# Patient Record
Sex: Female | Born: 1980 | Hispanic: No | Marital: Married | Smoking: Former smoker
Health system: Southern US, Community
[De-identification: ages and names within clinical notes are randomized; demographics above are authoritative.]

## PROBLEM LIST (undated history)

## (undated) ENCOUNTER — Inpatient Hospital Stay (HOSPITAL_COMMUNITY): Payer: Self-pay

## (undated) DIAGNOSIS — E669 Obesity, unspecified: Secondary | ICD-10-CM

## (undated) DIAGNOSIS — K625 Hemorrhage of anus and rectum: Secondary | ICD-10-CM

## (undated) HISTORY — DX: Obesity, unspecified: E66.9

## (undated) HISTORY — PX: NO PAST SURGERIES: SHX2092

---

## 2010-12-11 ENCOUNTER — Ambulatory Visit (INDEPENDENT_AMBULATORY_CARE_PROVIDER_SITE_OTHER): Payer: BC Managed Care – PPO | Admitting: Internal Medicine

## 2010-12-11 ENCOUNTER — Other Ambulatory Visit: Payer: Self-pay | Admitting: Internal Medicine

## 2010-12-11 ENCOUNTER — Ambulatory Visit (HOSPITAL_BASED_OUTPATIENT_CLINIC_OR_DEPARTMENT_OTHER)
Admission: RE | Admit: 2010-12-11 | Discharge: 2010-12-11 | Disposition: A | Discharge status: Home / Self Care | Payer: BC Managed Care – PPO | Source: Ambulatory Visit | Attending: Internal Medicine | Admitting: Internal Medicine

## 2010-12-11 DIAGNOSIS — R109 Unspecified abdominal pain: Secondary | ICD-10-CM | POA: Insufficient documentation

## 2010-12-11 DIAGNOSIS — K922 Gastrointestinal hemorrhage, unspecified: Secondary | ICD-10-CM

## 2010-12-11 DIAGNOSIS — E669 Obesity, unspecified: Secondary | ICD-10-CM

## 2010-12-11 DIAGNOSIS — Z975 Presence of (intrauterine) contraceptive device: Secondary | ICD-10-CM | POA: Insufficient documentation

## 2010-12-11 DIAGNOSIS — R918 Other nonspecific abnormal finding of lung field: Secondary | ICD-10-CM | POA: Insufficient documentation

## 2010-12-11 DIAGNOSIS — D279 Benign neoplasm of unspecified ovary: Secondary | ICD-10-CM | POA: Insufficient documentation

## 2010-12-11 MED ORDER — IOHEXOL 300 MG/ML  SOLN
100.0000 mL | Freq: Once | INTRAMUSCULAR | Status: AC | PRN
  Administered 2010-12-11: 100 mL via INTRAVENOUS

## 2010-12-13 ENCOUNTER — Ambulatory Visit: Payer: BC Managed Care – PPO | Admitting: Internal Medicine

## 2010-12-19 ENCOUNTER — Encounter: Payer: Self-pay | Admitting: Gastroenterology

## 2010-12-19 ENCOUNTER — Ambulatory Visit (INDEPENDENT_AMBULATORY_CARE_PROVIDER_SITE_OTHER): Payer: BC Managed Care – PPO | Admitting: Gastroenterology

## 2010-12-19 VITALS — BP 110/60 | HR 60 | Ht 65.5 in | Wt 232.8 lb

## 2010-12-19 DIAGNOSIS — K921 Melena: Secondary | ICD-10-CM

## 2010-12-19 DIAGNOSIS — R109 Unspecified abdominal pain: Secondary | ICD-10-CM

## 2010-12-19 DIAGNOSIS — K5900 Constipation, unspecified: Secondary | ICD-10-CM

## 2010-12-19 MED ORDER — PEG-KCL-NACL-NASULF-NA ASC-C 100 G PO SOLR: 1.0000 | Freq: Once | ORAL | Status: AC

## 2010-12-19 NOTE — Patient Instructions (Addendum)
You have been scheduled for a Colonoscopy. High fiber diet information given to patient. Start Miralax 17 gr in 8oz of water once or twice daily as needed. ZO:XWRUEA Schoenhoff, MD

## 2010-12-19 NOTE — Progress Notes (Signed)
History of Present Illness: This is a 30 year old female presenting today for evaluation of new-onset constipation, lower abdominal pain and rectal bleeding. She relates a history of bright red blood per rectum on the tissue paper, stool and in the commode that occurred approximately one year ago. Her symptoms recurred about 2 weeks ago. She describes blood on the tissue paper, on the stool and in the commode associated with a bowel movement. She has had constipation with straining for the past 2 weeks. This has also been associated with lower abdominal pain that is relieved by a bowel movement. She underwent a CT scan of the abdomen and pelvis on May 1 which was unremarkable. CBC, metabolic panel and TSH on May 1 was all normal. She denies weight loss, change in stool caliber, nausea, vomiting, fevers, chills.  History reviewed. No pertinent past medical history. History reviewed. No pertinent past surgical history.  reports that she quit smoking about 2 years ago. She has never used smokeless tobacco. She reports that she drinks alcohol. She reports that she does not use illicit drugs. family history includes Diabetes in her maternal aunt and maternal grandmother and Pancreatic cancer in her maternal grandmother.  There is no history of Colon cancer. No Known Allergies  Outpatient Encounter Prescriptions as of 12/19/2010  Medication Sig Dispense Refill  . peg 3350 powder (MOVIPREP) 100 G SOLR Take 1 kit (100 g total) by mouth once.  1 kit  0   Review of Systems: Pertinent positive and negative review of systems were noted in the above HPI section. All other review of systems were otherwise negative.  Physical Exam: General: Well developed , well nourished, no acute distress Head: Normocephalic and atraumatic Eyes:  sclerae anicteric, EOMI Ears: Normal auditory acuity Mouth: No deformity or lesions Neck: Supple, no masses or thyromegaly Lungs: Clear throughout to auscultation Heart: Regular rate  and rhythm; no murmurs, rubs or bruits Abdomen: Soft, non tender and non distended. No masses, hepatosplenomegaly or hernias noted. Normal Bowel sounds Rectal: Deferred to colonoscopy, recent exam by her primary care physician showed no lesions and Hemoccult-positive stool Musculoskeletal: Symmetrical with no gross deformities  Skin: No lesions on visible extremities Pulses:  Normal pulses noted Extremities: No clubbing, cyanosis, edema or deformities noted Neurological: Alert oriented x 4, grossly nonfocal Cervical Nodes:  No significant cervical adenopathy Inguinal Nodes: No significant inguinal adenopathy Psychological:  Alert and cooperative. Normal mood and affect  Assessment and Recommendations:  1. Hematochezia and Hemoccult-positive stool. I suspect she has a benign anorectal source such as hemorrhoids however proctitis, inflammatory bowel disease and colorectal neoplasms need to be definitively excluded. Schedule colonoscopy. The risks, benefits, and alternatives to colonoscopy with possible biopsy and possible polypectomy were discussed with the patient and they consent to proceed.    2. Recent onset constipation associated with mild lower abdominal pain. The abdominal pain is promptly relieved with bowel movements and her recent CT scan of the abdomen and pelvis was negative. I suspect the pain is simply related to constipation. Substantially increase her dietary fiber and water intake. She may use MiraLax once or twice daily as needed. Further evaluation of this sudden change in bowel habits with colonoscopy as above.

## 2010-12-24 ENCOUNTER — Encounter: Payer: Self-pay | Admitting: Gastroenterology

## 2010-12-26 ENCOUNTER — Ambulatory Visit: Payer: BC Managed Care – PPO | Admitting: Internal Medicine

## 2010-12-26 ENCOUNTER — Ambulatory Visit: Payer: BC Managed Care – PPO | Admitting: Obstetrics & Gynecology

## 2011-01-01 ENCOUNTER — Other Ambulatory Visit: Payer: BC Managed Care – PPO | Admitting: Gastroenterology

## 2011-01-02 ENCOUNTER — Encounter: Payer: Self-pay | Admitting: Internal Medicine

## 2011-01-30 ENCOUNTER — Encounter: Payer: Self-pay | Admitting: Internal Medicine

## 2011-05-31 ENCOUNTER — Emergency Department (HOSPITAL_BASED_OUTPATIENT_CLINIC_OR_DEPARTMENT_OTHER)
Admission: EM | Admit: 2011-05-31 | Discharge: 2011-05-31 | Disposition: A | Discharge status: Home / Self Care | Payer: Self-pay | Attending: Emergency Medicine | Admitting: Emergency Medicine

## 2011-05-31 ENCOUNTER — Encounter (HOSPITAL_BASED_OUTPATIENT_CLINIC_OR_DEPARTMENT_OTHER): Payer: Self-pay

## 2011-05-31 DIAGNOSIS — S335XXA Sprain of ligaments of lumbar spine, initial encounter: Secondary | ICD-10-CM | POA: Insufficient documentation

## 2011-05-31 DIAGNOSIS — X500XXA Overexertion from strenuous movement or load, initial encounter: Secondary | ICD-10-CM | POA: Insufficient documentation

## 2011-05-31 DIAGNOSIS — R51 Headache: Secondary | ICD-10-CM | POA: Insufficient documentation

## 2011-05-31 DIAGNOSIS — E669 Obesity, unspecified: Secondary | ICD-10-CM | POA: Insufficient documentation

## 2011-05-31 DIAGNOSIS — S39012A Strain of muscle, fascia and tendon of lower back, initial encounter: Secondary | ICD-10-CM

## 2011-05-31 DIAGNOSIS — R079 Chest pain, unspecified: Secondary | ICD-10-CM | POA: Insufficient documentation

## 2011-05-31 HISTORY — DX: Hemorrhage of anus and rectum: K62.5

## 2011-05-31 LAB — PREGNANCY, URINE: Preg Test, Ur: NEGATIVE

## 2011-05-31 LAB — URINALYSIS, ROUTINE W REFLEX MICROSCOPIC
Ketones, ur: NEGATIVE mg/dL
Leukocytes, UA: NEGATIVE
Nitrite: NEGATIVE
pH: 5.5 (ref 5.0–8.0)

## 2011-05-31 MED ORDER — IBUPROFEN 800 MG PO TABS
800.0000 mg | ORAL_TABLET | Freq: Once | ORAL | Status: AC
  Administered 2011-05-31: 800 mg via ORAL
  Filled 2011-05-31: qty 1

## 2011-05-31 MED ORDER — IBUPROFEN 800 MG PO TABS: 800.0000 mg | ORAL_TABLET | Freq: Three times a day (TID) | ORAL | Status: AC

## 2011-05-31 NOTE — ED Notes (Signed)
Lower back pain started this am-denies injury-nausea/dirrhea-denies urinary s/s

## 2011-05-31 NOTE — ED Provider Notes (Signed)
History     CSN: [id number] Arrival date & time: 05/31/2011  2:10 PM   First MD Initiated Contact with Patient 05/31/11 1428      Chief Complaint  Patient presents with  . Back Pain    (Consider location/radiation/quality/duration/timing/severity/associated sxs/prior treatment) HPI Comments: Patient complains of pain in her lower. This seemed to come on this morning. She also notes some headache, mild pain in the chest stinging feeling in her abdomen, and some numbness in her hands. There is some nausea and diarrhea. She feels hot. Her health is usually good except for obesity.  Patient is a 30 y.o. female presenting with back pain. The history is provided by the patient.  Back Pain  This is a new problem. The current episode started 12 to 24 hours ago. The problem occurs constantly. The problem has not changed since onset.The pain is associated with no known injury. The pain is present in the lumbar spine. The quality of the pain is described as aching. The pain does not radiate. The pain is at a severity of 8/10. The pain is moderate. The symptoms are aggravated by bending and twisting. Associated symptoms include chest pain, headaches and abdominal pain. Pertinent negatives include no dysuria. She has tried nothing for the symptoms. Risk factors include obesity and lack of exercise.    Past Medical History  Diagnosis Date  . Obesity   . Rectal bleeding     Past Surgical History  Procedure Date  . No past surgeries     Family History  Problem Relation Age of Onset  . Pancreatic cancer Maternal Grandmother   . Diabetes Maternal Grandmother   . Diabetes Maternal Aunt   . Colon cancer Neg Hx     History  Substance Use Topics  . Smoking status: Current Everyday Smoker    Last Attempt to Quit: 08/12/2008  . Smokeless tobacco: Never Used  . Alcohol Use: No     socially    OB History    Grav Para Term Preterm Abortions TAB SAB Ect Mult Living                  Review  of Systems  Constitutional: Negative.   Eyes: Negative.   Respiratory: Negative.   Cardiovascular: Positive for chest pain.  Gastrointestinal: Positive for nausea and abdominal pain.  Genitourinary: Negative for dysuria.  Musculoskeletal: Positive for back pain.  Neurological: Positive for headaches.  Psychiatric/Behavioral: Negative.     Allergies  Review of patient's allergies indicates no known allergies.  Home Medications   Current Outpatient Rx  Name Route Sig Dispense Refill  . IBUPROFEN 800 MG PO TABS Oral Take 1 tablet (800 mg total) by mouth 3 (three) times daily. 21 tablet 0  . ONE-DAILY MULTI VITAMINS PO TABS Oral Take 1 tablet by mouth daily.      . WEIGHT LOSS DAILY MULTI PO Oral Take 1 tablet by mouth. As directed       BP 130/84  Pulse 111  Temp(Src) 98.8 F (37.1 C) (Oral)  Resp 18  Ht 5\' 6"  (1.676 m)  Wt 240 lb (108.863 kg)  BMI 38.74 kg/m2  SpO2 98%  LMP 05/27/2011  Physical Exam  Constitutional: She is oriented to person, place, and time.       Patient is a morbidly obese woman in mild distress she localizes pain to the lumbar region.  HENT:  Head: Normocephalic and atraumatic.  Right Ear: External ear normal.  Left Ear: External ear normal.  Mouth/Throat: Oropharynx is clear and moist.  Eyes: Conjunctivae and EOM are normal. Pupils are equal, round, and reactive to light.  Neck: Normal range of motion. Neck supple. No thyromegaly present.  Cardiovascular: Normal rate, regular rhythm and normal heart sounds.   Pulmonary/Chest: Effort normal and breath sounds normal.  Abdominal: Soft. Bowel sounds are normal. She exhibits no distension. There is no tenderness.  Musculoskeletal:       She localizes pain to the lumbar region. There is no palpable deformity or point tenderness there.  Lymphadenopathy:    She has no cervical adenopathy.  Neurological: She is alert and oriented to person, place, and time.       There is no sensory or motor deficit.    Skin: Skin is warm and dry.  Psychiatric: She has a normal mood and affect. Her behavior is normal.    ED Course  Procedures (including critical care time)  3:09 PM Patient's physical exam was entirely benign. There was no indication for imaging. I prescribed her ibuprofen 800 mg 3 times a day. She should rest on a firm surface, and avoid heavy lifting.  Labs Reviewed  PREGNANCY, URINE  URINALYSIS, ROUTINE W REFLEX MICROSCOPIC   No results found.   1. Lumbar strain          Carleene Cooper III, MD 05/31/11 [phone]

## 2011-05-31 NOTE — ED Notes (Signed)
Dc instruction provided, meds reviewed, questions answered, gait steady and able to ambulate.

## 2011-11-02 IMAGING — CT CT ABD-PELV W/ CM
2 of 3 series · 16 of 46 positions shown, 18 images · IV contrast (APPLIED)
Comparison: None.

CLINICAL DATA: Lower abdominal pain, recurrent GI bleeding

CT ABDOMEN AND PELVIS WITH CONTRAST
TECHNIQUE: Multidetector CT imaging of the abdomen and pelvis was
performed following the standard protocol during bolus
administration of intravenous contrast.
Contrast: 100 ml Hmnipaque-KJJ

[Series 2: abd/pelvis 5.0 b31f · axial · 0.85mm/px · z∈[-265,+155]mm · 13 of 98 slices shown, 15 images]
[im 7/98  soft-tissue]
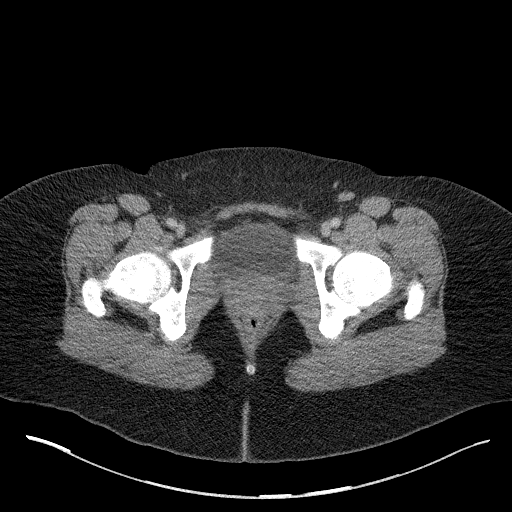
[im 7/98  bone]
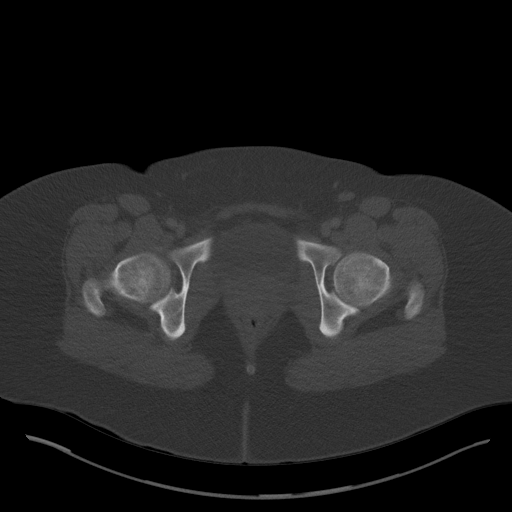
[im 13/98  soft-tissue]
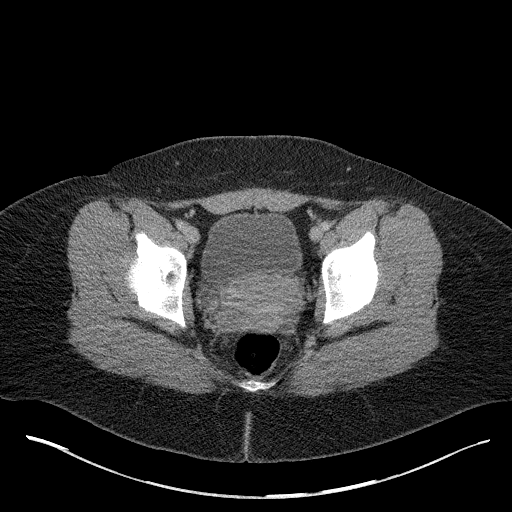
[im 19/98  soft-tissue]
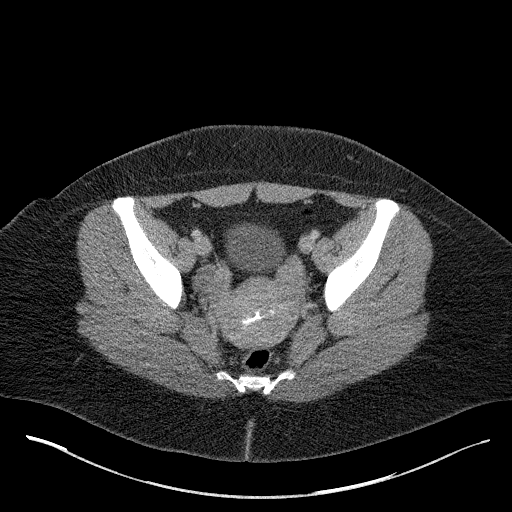
[im 29/98  soft-tissue]
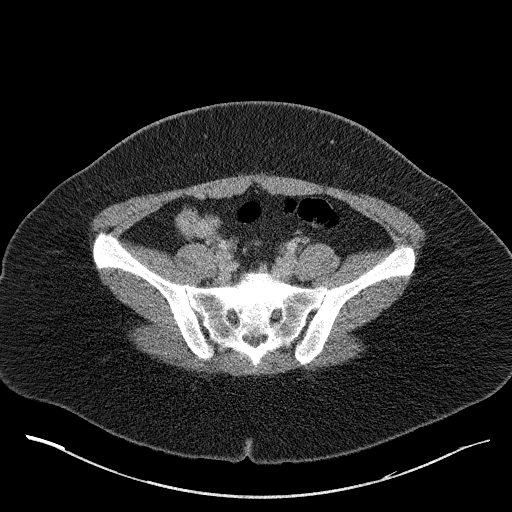
[im 35/98  soft-tissue]
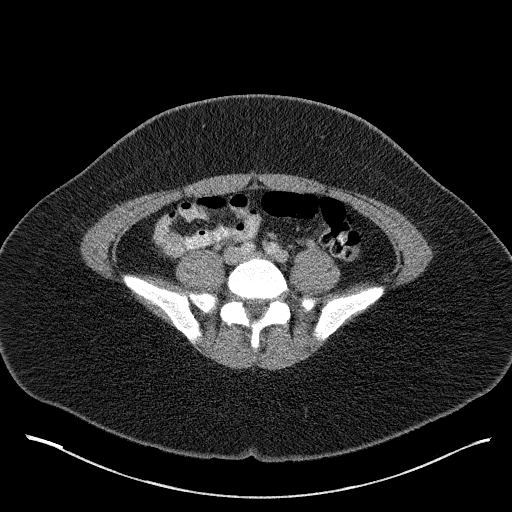
[im 41/98  soft-tissue]
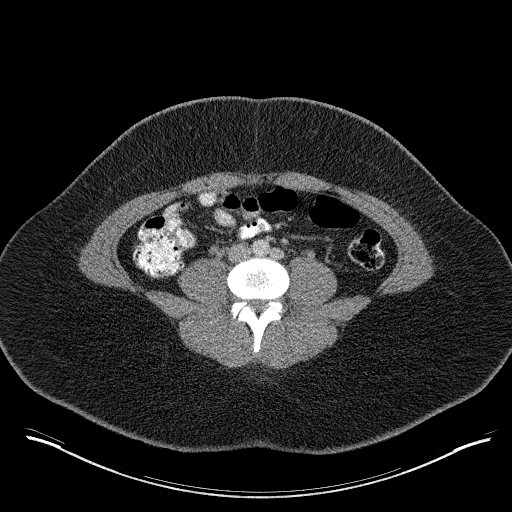
[im 51/98  soft-tissue]
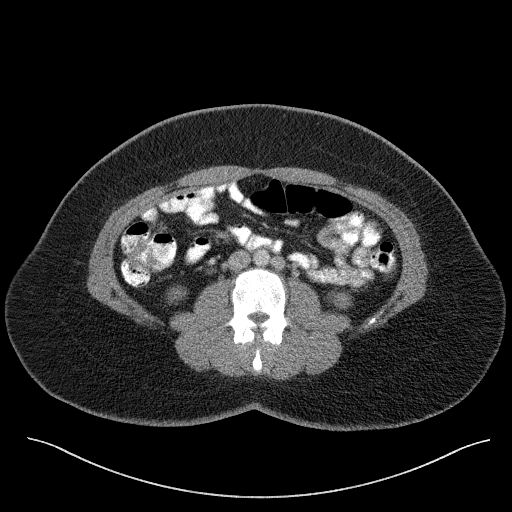
[im 57/98  soft-tissue]
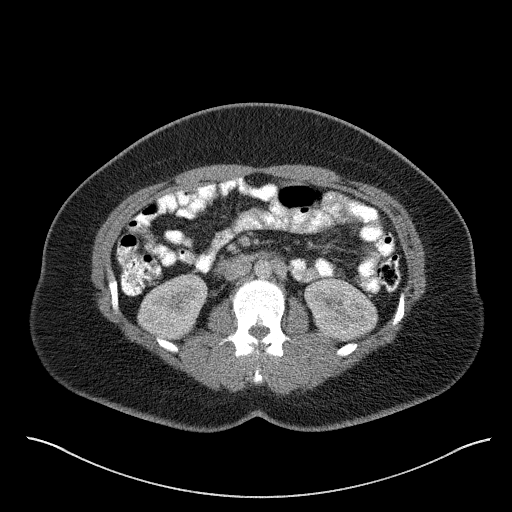
[im 63/98  soft-tissue]
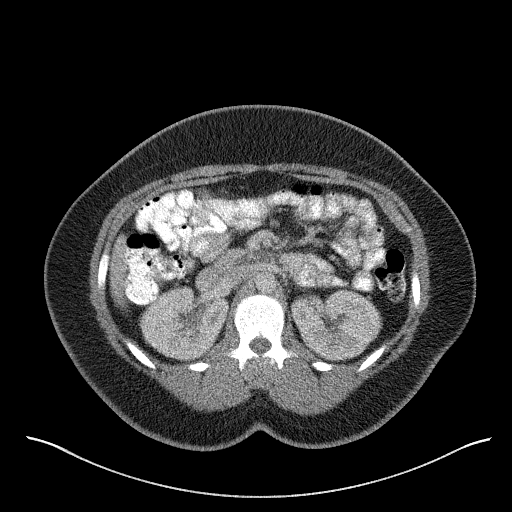
[im 63/98  bone]
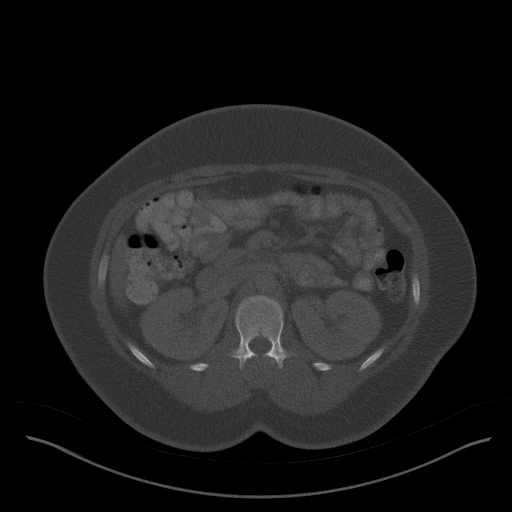
[im 69/98  soft-tissue]
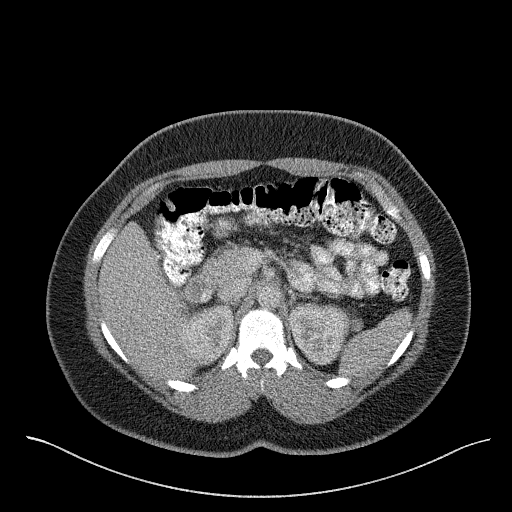
[im 79/98  soft-tissue]
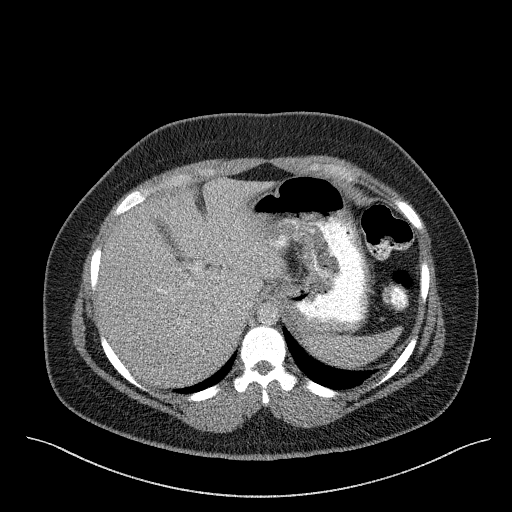
[im 85/98  soft-tissue]
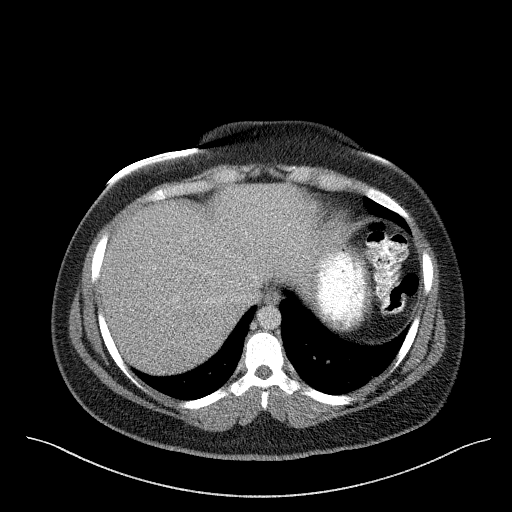
[im 91/98  soft-tissue]
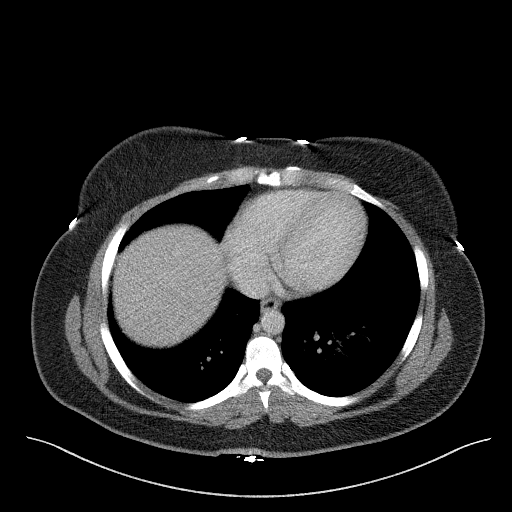

[Series 5: abd/pelvis 3.0 coronal · coronal · 0.79mm/px · 3 of 102 slices shown]
[im 34/102  soft-tissue]
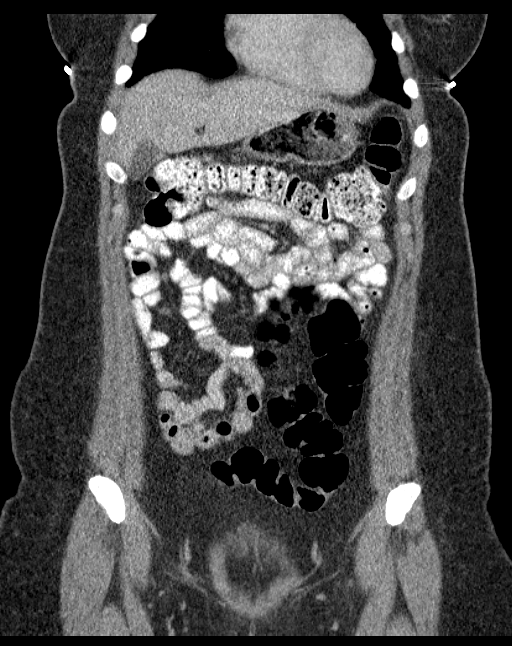
[im 45/102  soft-tissue]
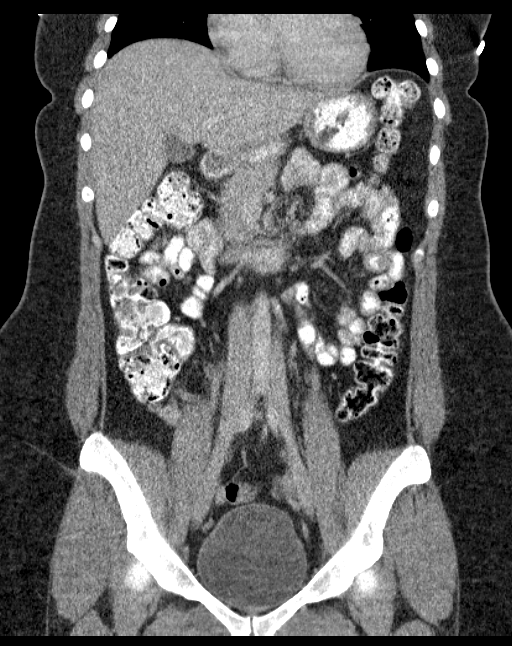
[im 57/102  soft-tissue]
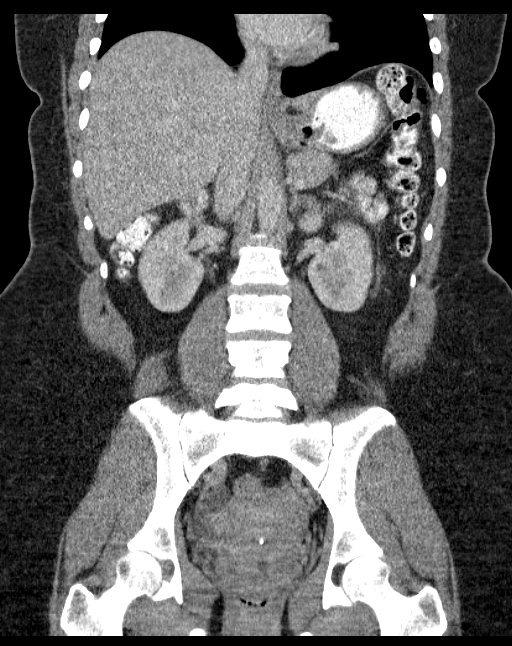

[16 of 46 positions shown; findings below may reference images not displayed]

FINDINGS: There is parenchymal opacity within the posterior
inferior left lower lobe suspicious for pneumonia.  Clinical
correlation is recommended.

The liver enhances with no focal abnormality and no ductal
dilatation is seen.  No calcified gallstones are noted.  The
pancreas is normal in size and the pancreatic duct is not dilated.
The adrenal glands and spleen are unremarkable.  The kidneys
enhance with no calculus or mass and no hydronephrosis is seen.
The abdominal aorta in caliber.

The appendix and terminal ileum are well seen and appear normal.
The uterus is normal in size.  An IUD is noted centrally.  The
urinary bladder is unremarkable.  There is a cystic structure in
the right adnexa consistent a right ovarian cyst of 31 x 23 mm.  No
abnormality of the colon is seen.  No bony abnormality is noted.
IMPRESSION: 1.  Parenchymal opacity in the posterior left lower lobe suspicious
for pneumonia.  Correlate clinically.
2.  Right ovarian cyst.  IUD within the mid uterus.
3.  The appendix and terminal ileum appear normal.

## 2013-05-15 ENCOUNTER — Emergency Department (INDEPENDENT_AMBULATORY_CARE_PROVIDER_SITE_OTHER)
Admission: EM | Admit: 2013-05-15 | Discharge: 2013-05-15 | Disposition: A | Discharge status: Home / Self Care | Payer: Self-pay | Source: Home / Self Care | Attending: Emergency Medicine | Admitting: Emergency Medicine

## 2013-05-15 ENCOUNTER — Other Ambulatory Visit (HOSPITAL_COMMUNITY)
Admission: RE | Admit: 2013-05-15 | Discharge: 2013-05-15 | Disposition: A | Discharge status: Home / Self Care | Payer: BC Managed Care – PPO | Source: Ambulatory Visit | Attending: Emergency Medicine | Admitting: Emergency Medicine

## 2013-05-15 ENCOUNTER — Encounter (HOSPITAL_COMMUNITY): Payer: Self-pay | Admitting: Emergency Medicine

## 2013-05-15 DIAGNOSIS — R110 Nausea: Secondary | ICD-10-CM

## 2013-05-15 DIAGNOSIS — N760 Acute vaginitis: Secondary | ICD-10-CM

## 2013-05-15 DIAGNOSIS — R102 Pelvic and perineal pain unspecified side: Secondary | ICD-10-CM

## 2013-05-15 DIAGNOSIS — R350 Frequency of micturition: Secondary | ICD-10-CM

## 2013-05-15 DIAGNOSIS — Z113 Encounter for screening for infections with a predominantly sexual mode of transmission: Secondary | ICD-10-CM | POA: Insufficient documentation

## 2013-05-15 DIAGNOSIS — R5383 Other fatigue: Secondary | ICD-10-CM

## 2013-05-15 DIAGNOSIS — M545 Low back pain, unspecified: Secondary | ICD-10-CM

## 2013-05-15 DIAGNOSIS — R5381 Other malaise: Secondary | ICD-10-CM

## 2013-05-15 DIAGNOSIS — N949 Unspecified condition associated with female genital organs and menstrual cycle: Secondary | ICD-10-CM

## 2013-05-15 LAB — POCT URINALYSIS DIP (DEVICE)
Ketones, ur: NEGATIVE mg/dL
Leukocytes, UA: NEGATIVE
Protein, ur: NEGATIVE mg/dL
Urobilinogen, UA: 0.2 mg/dL (ref 0.0–1.0)

## 2013-05-15 LAB — POCT I-STAT, CHEM 8
Calcium, Ion: 1.18 mmol/L (ref 1.12–1.23)
Chloride: 105 mEq/L (ref 96–112)
HCT: 43 % (ref 36.0–46.0)
TCO2: 23 mmol/L (ref 0–100)

## 2013-05-15 LAB — POCT PREGNANCY, URINE: Preg Test, Ur: NEGATIVE

## 2013-05-15 MED ORDER — CEFTRIAXONE SODIUM 250 MG IJ SOLR
INTRAMUSCULAR | Status: AC
  Filled 2013-05-15: qty 250

## 2013-05-15 MED ORDER — CEFTRIAXONE SODIUM 250 MG IJ SOLR
250.0000 mg | Freq: Once | INTRAMUSCULAR | Status: AC
  Administered 2013-05-15: 250 mg via INTRAMUSCULAR

## 2013-05-15 MED ORDER — MELOXICAM 15 MG PO TABS: 15.0000 mg | ORAL_TABLET | Freq: Every day | ORAL | Status: DC

## 2013-05-15 MED ORDER — METRONIDAZOLE 500 MG PO TABS: 500.0000 mg | ORAL_TABLET | Freq: Three times a day (TID) | ORAL | Status: DC

## 2013-05-15 MED ORDER — AZITHROMYCIN 250 MG PO TABS
ORAL_TABLET | ORAL | Status: AC
  Filled 2013-05-15: qty 4

## 2013-05-15 MED ORDER — AZITHROMYCIN 250 MG PO TABS
1000.0000 mg | ORAL_TABLET | Freq: Once | ORAL | Status: AC
  Administered 2013-05-15: 1000 mg via ORAL
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
## 2013-05-15 NOTE — ED Provider Notes (Signed)
Chief Complaint:   Chief Complaint  Patient presents with  . Fatigue    History of Present Illness:   Brianna Figueroa is a 32 year old female who presents with multiple complaints today all going on for about 2 weeks. She felt tired, sleepy, and somewhat stressed out. Patient states she has some blurring of her vision. She's been nauseated and had some left lower quadrant pain. She's had left lower back pain for about one to 2 years. She does frequent urination, excessive thirst, vaginal discharge and itching, nasal congestion, hair loss, and dizzy spells for the past week. The patient is sexually active with use of condoms. Last menstrual period was June 27. She took a home pregnancy test which was negative. She denies fever, chills, headache, sore throat, adenopathy, shortness of breath, chest pain, nausea, vomiting, diarrhea, dysuria, or extremity pain, paresthesias, weakness, or swelling. Her biggest concern is to find out if she is pregnant.  Review of Systems:  Other than noted above, the patient denies any of the following symptoms. Systemic:  No fever, chills, sweats, fatigue, myalgias, headache, or anorexia. Eye:  No redness, pain or drainage. ENT:  No earache, nasal congestion, rhinorrhea, sinus pressure, or sore throat. Lungs:  No cough, sputum production, wheezing, shortness of breath.  Cardiovascular:  No chest pain, palpitations, or syncope. GI:  No nausea, vomiting, abdominal pain or diarrhea. GU:  No dysuria, frequency, or hematuria. Skin:  No rash or pruritis.  PMFSH:  Past medical history, family history, social history, meds, and allergies were reviewed.    Physical Exam:   Vital signs:  BP 121/84  Pulse 76  Temp(Src) 98 F (36.7 C) (Oral)  Resp 18  SpO2 97%  LMP 02/02/2013 General:  Alert, in no distress. Eye:  PERRL, full EOMs.  Lids and conjunctivas were normal. ENT:  TMs and canals were normal, without erythema or inflammation.  Nasal mucosa was clear and  uncongested, without drainage.  Mucous membranes were moist.  Pharynx was clear, without exudate or drainage.  There were no oral ulcerations or lesions. Neck:  Supple, no adenopathy, tenderness or mass. Thyroid was normal. Lungs:  No respiratory distress.  Lungs were clear to auscultation, without wheezes, rales or rhonchi.  Breath sounds were clear and equal bilaterally. Heart:  Regular rhythm, without gallops, murmers or rubs. Abdomen:  Soft, flat, and non-tender to palpation.  No hepatosplenomagaly or mass. Pelvic: Normal external genitalia, vaginal and cervical mucosa were normal. There was no discharge or bleeding. She has no cervical motion pain. Uterus is mid position normal in size and shape. There is mild uterine tenderness and moderate left adnexal tenderness with no mass. No right adnexal tenderness or mass. DNA probes for gonorrhea, Chlamydia, Trichomonas, Gardnerella, Candida were obtained. Back: There is tenderness to palpation in the left paravertebral area. Her back has about a 50% of normal range of motion with pain. Straight leg raising is negative. Neurological exam: DTRs, muscle strength, and sensation in lower extremities. Extremities: No edema, pulses were full. Skin:  Clear, warm, and dry, without rash or lesions.  Labs:   Results for orders placed during the hospital encounter of 05/15/13  POCT URINALYSIS DIP (DEVICE)      Result Value Range   Glucose, UA NEGATIVE  NEGATIVE mg/dL   Bilirubin Urine NEGATIVE  NEGATIVE   Ketones, ur NEGATIVE  NEGATIVE mg/dL   Specific Gravity, Urine 1.015  1.005 - 1.030   Hgb urine dipstick NEGATIVE  NEGATIVE   pH 7.0  5.0 -  8.0   Protein, ur NEGATIVE  NEGATIVE mg/dL   Urobilinogen, UA 0.2  0.0 - 1.0 mg/dL   Nitrite NEGATIVE  NEGATIVE   Leukocytes, UA NEGATIVE  NEGATIVE  POCT PREGNANCY, URINE      Result Value Range   Preg Test, Ur NEGATIVE  NEGATIVE  POCT I-STAT, CHEM 8      Result Value Range   Sodium 141  135 - 145 mEq/L    Potassium 3.8  3.5 - 5.1 mEq/L   Chloride 105  96 - 112 mEq/L   BUN 12  6 - 23 mg/dL   Creatinine, Ser 1.61  0.50 - 1.10 mg/dL   Glucose, Bld 92  70 - 99 mg/dL   Calcium, Ion 0.96  0.45 - 1.23 mmol/L   TCO2 23  0 - 100 mmol/L   Hemoglobin 14.6  12.0 - 15.0 g/dL   HCT 40.9  81.1 - 91.4 %    Course in Urgent Care Center:   Given Rocephin 250 mg IM and azithromycin 1000 mg by mouth.  Assessment:  The primary encounter diagnosis was Fatigue. Diagnoses of Nausea, Pelvic pain, Low back pain, Vaginitis, and Frequent urination were also pertinent to this visit.  Cause of the fatigue remains unknown. Pelvic pain may be due to PID versus ovarian cyst. Will treat for PID today and she will followup with her gynecologist next week.  Plan:   1.  Meds:  The following meds were prescribed:   Discharge Medication List as of 05/15/2013 10:19 AM    START taking these medications   Details  meloxicam (MOBIC) 15 MG tablet Take 1 tablet (15 mg total) by mouth daily., Starting 05/15/2013, Until Discontinued, Normal    metroNIDAZOLE (FLAGYL) 500 MG tablet Take 1 tablet (500 mg total) by mouth 3 (three) times daily., Starting 05/15/2013, Until Discontinued, Normal        2.  Patient Education/Counseling:  The patient was given appropriate handouts, self care instructions, and instructed in symptomatic relief.    3.  Follow up:  The patient was told to follow up if no better in 3 to 4 days, if becoming worse in any way, and given some red flag symptoms such as worsening pain which would prompt immediate return.  Follow up with gynecologist next week.         Reuben Likes, MD 05/15/13 [phone]

## 2013-05-15 NOTE — ED Notes (Signed)
Pt      Reports         Tired        Nausea      Back pain         Frequent  Urination           Dizzy   Late on  Her  Period       As  Well  As  Vaginal  Discharge

## 2013-08-17 ENCOUNTER — Emergency Department (HOSPITAL_COMMUNITY)
Admission: EM | Admit: 2013-08-17 | Discharge: 2013-08-17 | Disposition: A | Discharge status: Home / Self Care | Payer: No Typology Code available for payment source | Source: Home / Self Care

## 2013-08-25 ENCOUNTER — Encounter (HOSPITAL_COMMUNITY): Payer: Self-pay | Admitting: Emergency Medicine

## 2013-08-25 ENCOUNTER — Emergency Department (INDEPENDENT_AMBULATORY_CARE_PROVIDER_SITE_OTHER)
Admission: EM | Admit: 2013-08-25 | Discharge: 2013-08-25 | Disposition: A | Discharge status: Home / Self Care | Payer: No Typology Code available for payment source | Source: Home / Self Care

## 2013-08-25 DIAGNOSIS — L259 Unspecified contact dermatitis, unspecified cause: Secondary | ICD-10-CM

## 2013-08-25 DIAGNOSIS — R109 Unspecified abdominal pain: Secondary | ICD-10-CM

## 2013-08-25 DIAGNOSIS — R10811 Right upper quadrant abdominal tenderness: Secondary | ICD-10-CM

## 2013-08-25 DIAGNOSIS — R195 Other fecal abnormalities: Secondary | ICD-10-CM

## 2013-08-25 DIAGNOSIS — G8929 Other chronic pain: Secondary | ICD-10-CM

## 2013-08-25 LAB — POCT URINALYSIS DIP (DEVICE)
BILIRUBIN URINE: NEGATIVE
Glucose, UA: NEGATIVE mg/dL
HGB URINE DIPSTICK: NEGATIVE
KETONES UR: NEGATIVE mg/dL
Leukocytes, UA: NEGATIVE
Nitrite: NEGATIVE
PH: 7 (ref 5.0–8.0)
PROTEIN: NEGATIVE mg/dL
SPECIFIC GRAVITY, URINE: 1.01 (ref 1.005–1.030)
Urobilinogen, UA: 0.2 mg/dL (ref 0.0–1.0)

## 2013-08-25 LAB — POCT PREGNANCY, URINE: Preg Test, Ur: NEGATIVE

## 2013-08-25 MED ORDER — TRIAMCINOLONE ACETONIDE 0.1 % EX CREA: 1.0000 "application " | TOPICAL_CREAM | Freq: Two times a day (BID) | CUTANEOUS | Status: AC

## 2013-08-25 NOTE — Discharge Instructions (Signed)
Abdominal Pain, Women °Abdominal (stomach, pelvic, or belly) pain can be caused by many things. It is important to tell your doctor: °· The location of the pain. °· Does it come and go or is it present all the time? °· Are there things that start the pain (eating certain foods, exercise)? °· Are there other symptoms associated with the pain (fever, nausea, vomiting, diarrhea)? °All of this is helpful to know when trying to find the cause of the pain. °CAUSES  °· Stomach: virus or bacteria infection, or ulcer. °· Intestine: appendicitis (inflamed appendix), regional ileitis (Crohn's disease), ulcerative colitis (inflamed colon), irritable bowel syndrome, diverticulitis (inflamed diverticulum of the colon), or cancer of the stomach or intestine. °· Gallbladder disease or stones in the gallbladder. °· Kidney disease, kidney stones, or infection. °· Pancreas infection or cancer. °· Fibromyalgia (pain disorder). °· Diseases of the female organs: °· Uterus: fibroid (non-cancerous) tumors or infection. °· Fallopian tubes: infection or tubal pregnancy. °· Ovary: cysts or tumors. °· Pelvic adhesions (scar tissue). °· Endometriosis (uterus lining tissue growing in the pelvis and on the pelvic organs). °· Pelvic congestion syndrome (female organs filling up with blood just before the menstrual period). °· Pain with the menstrual period. °· Pain with ovulation (producing an egg). °· Pain with an IUD (intrauterine device, birth control) in the uterus. °· Cancer of the female organs. °· Functional pain (pain not caused by a disease, may improve without treatment). °· Psychological pain. °· Depression. °DIAGNOSIS  °Your doctor will decide the seriousness of your pain by doing an examination. °· Blood tests. °· X-rays. °· Ultrasound. °· CT scan (computed tomography, special type of X-ray). °· MRI (magnetic resonance imaging). °· Cultures, for infection. °· Barium enema (dye inserted in the large intestine, to better view it with  X-rays). °· Colonoscopy (looking in intestine with a lighted tube). °· Laparoscopy (minor surgery, looking in abdomen with a lighted tube). °· Major abdominal exploratory surgery (looking in abdomen with a large incision). °TREATMENT  °The treatment will depend on the cause of the pain.  °· Many cases can be observed and treated at home. °· Over-the-counter medicines recommended by your caregiver. °· Prescription medicine. °· Antibiotics, for infection. °· Birth control pills, for painful periods or for ovulation pain. °· Hormone treatment, for endometriosis. °· Nerve blocking injections. °· Physical therapy. °· Antidepressants. °· Counseling with a psychologist or psychiatrist. °· Minor or major surgery. °HOME CARE INSTRUCTIONS  °· Do not take laxatives, unless directed by your caregiver. °· Take over-the-counter pain medicine only if ordered by your caregiver. Do not take aspirin because it can cause an upset stomach or bleeding. °· Try a clear liquid diet (broth or water) as ordered by your caregiver. Slowly move to a bland diet, as tolerated, if the pain is related to the stomach or intestine. °· Have a thermometer and take your temperature several times a day, and record it. °· Bed rest and sleep, if it helps the pain. °· Avoid sexual intercourse, if it causes pain. °· Avoid stressful situations. °· Keep your follow-up appointments and tests, as your caregiver orders. °· If the pain does not go away with medicine or surgery, you may try: °· Acupuncture. °· Relaxation exercises (yoga, meditation). °· Group therapy. °· Counseling. °SEEK MEDICAL CARE IF:  °· You notice certain foods cause stomach pain. °· Your home care treatment is not helping your pain. °· You need stronger pain medicine. °· You want your IUD removed. °· You feel faint or   lightheaded.  You develop nausea and vomiting.  You develop a rash.  You are having side effects or an allergy to your medicine. SEEK IMMEDIATE MEDICAL CARE IF:   Your  pain does not go away or gets worse.  You have a fever.  Your pain is felt only in portions of the abdomen. The right side could possibly be appendicitis. The left lower portion of the abdomen could be colitis or diverticulitis.  You are passing blood in your stools (bright red or black tarry stools, with or without vomiting).  You have blood in your urine.  You develop chills, with or without a fever.  You pass out. MAKE SURE YOU:   Understand these instructions.  Will watch your condition.  Will get help right away if you are not doing well or get worse. Document Released: 05/26/2007 Document Revised: 10/21/2011 Document Reviewed: 06/15/2009 [facility] Patient Information 2014 [place], [place].  Contact Dermatitis Contact dermatitis is a reaction to certain substances that touch the skin. Contact dermatitis can be either irritant contact dermatitis or allergic contact dermatitis. Irritant contact dermatitis does not require previous exposure to the substance for a reaction to occur.Allergic contact dermatitis only occurs if you have been exposed to the substance before. Upon a repeat exposure, your body reacts to the substance.  CAUSES  Many substances can cause contact dermatitis. Irritant dermatitis is most commonly caused by repeated exposure to mildly irritating substances, such as:  Makeup.  Soaps.  Detergents.  Bleaches.  Acids.  Metal salts, such as nickel. Allergic contact dermatitis is most commonly caused by exposure to:  Poisonous plants.  Chemicals (deodorants, shampoos).  Jewelry.  Latex.  Neomycin in triple antibiotic cream.  Preservatives in products, including clothing. SYMPTOMS  The area of skin that is exposed may develop:  Dryness or flaking.  Redness.  Cracks.  Itching.  Pain or a burning sensation.  Blisters. With allergic contact dermatitis, there may also be swelling in areas such as the eyelids, mouth, or genitals.  DIAGNOSIS    Your caregiver can usually tell what the problem is by doing a physical exam. In cases where the cause is uncertain and an allergic contact dermatitis is suspected, a patch skin test may be performed to help determine the cause of your dermatitis. TREATMENT Treatment includes protecting the skin from further contact with the irritating substance by avoiding that substance if possible. Barrier creams, powders, and gloves may be helpful. Your caregiver may also recommend:  Steroid creams or ointments applied 2 times daily. For best results, soak the rash area in cool water for 20 minutes. Then apply the medicine. Cover the area with a plastic wrap. You can store the steroid cream in the refrigerator for a "chilly" effect on your rash. That may decrease itching. Oral steroid medicines may be needed in more severe cases.  Antibiotics or antibacterial ointments if a skin infection is present.  Antihistamine lotion or an antihistamine taken by mouth to ease itching.  Lubricants to keep moisture in your skin.  Burow's solution to reduce redness and soreness or to dry a weeping rash. Mix one packet or tablet of solution in 2 cups cool water. Dip a clean washcloth in the mixture, wring it out a bit, and put it on the affected area. Leave the cloth in place for 30 minutes. Do this as often as possible throughout the day.  Taking several cornstarch or baking soda baths daily if the area is too large to cover with a washcloth. Harsh  chemicals, such as alkalis or acids, can cause skin damage that is like a burn. You should flush your skin for 15 to 20 minutes with cold water after such an exposure. You should also seek immediate medical care after exposure. Bandages (dressings), antibiotics, and pain medicine may be needed for severely irritated skin.  HOME CARE INSTRUCTIONS  Avoid the substance that caused your reaction.  Keep the area of skin that is affected away from hot water, soap, sunlight,  chemicals, acidic substances, or anything else that would irritate your skin.  Do not scratch the rash. Scratching may cause the rash to become infected.  You may take cool baths to help stop the itching.  Only take over-the-counter or prescription medicines as directed by your caregiver.  See your caregiver for follow-up care as directed to make sure your skin is healing properly. SEEK MEDICAL CARE IF:   Your condition is not better after 3 days of treatment.  You seem to be getting worse.  You see signs of infection such as swelling, tenderness, redness, soreness, or warmth in the affected area.  You have any problems related to your medicines. Document Released: 07/26/2000 Document Revised: 10/21/2011 Document Reviewed: 01/01/2011 [facility] Patient Information 2014 [place], [place].  Flank Pain Flank pain refers to pain that is located on the side of the body between the upper abdomen and the back. The pain may occur over a short period of time (acute) or may be long-term or reoccurring (chronic). It may be mild or severe. Flank pain can be caused by many things. CAUSES  Some of the more common causes of flank pain include:  Muscle strains.   Muscle spasms.   A disease of your spine (vertebral disk disease).   A lung infection (pneumonia).   Fluid around your lungs (pulmonary edema).   A kidney infection.   Kidney stones.   A very painful skin rash caused by the chickenpox virus (shingles).   Gallbladder disease.  [place] care will depend on the cause of your pain. In general,  Rest as directed by your caregiver.  Drink enough fluids to keep your urine clear or pale yellow.  Only take over-the-counter or prescription medicines as directed by your caregiver. Some medicines may help relieve the pain.  Tell your caregiver about any changes in your pain.  Follow up with your caregiver as directed. SEEK IMMEDIATE MEDICAL CARE IF:    Your pain is not controlled with medicine.   You have new or worsening symptoms.  Your pain increases.   You have abdominal pain.   You have shortness of breath.   You have persistent nausea or vomiting.   You have swelling in your abdomen.   You feel faint or pass out.   You have blood in your urine.  You have a fever or persistent symptoms for more than 2 3 days.  You have a fever and your symptoms suddenly get worse. MAKE SURE YOU:   Understand these instructions.  Will watch your condition.  Will get help right away if you are not doing well or get worse. Document Released: 09/19/2005 Document Revised: 04/22/2012 Document Reviewed: 03/12/2012 [facility] Patient Information 2014 [place].

## 2013-08-25 NOTE — ED Provider Notes (Signed)
CSN: [id number]     Arrival date & time 08/25/13  1136 History   First MD Initiated Contact with Patient 08/25/13 1321     Chief Complaint  Patient presents with  . Stool Color Change  . Fatigue  . Rash   (Consider location/radiation/quality/duration/timing/severity/associated sxs/prior Treatment) HPI Comments: 33 year old obese female complaining of light clay colored stools for the past 2 days. She has also had associated constipation and mild discomfort in the upper abdomen. She also has pain in the left flank that was worse early this morning. The pain has been intermittent and present for 3 months. She also has a fine itchy rash located to the right upper arm and forearm. There is an ovoid, red rash to the right side of the neck as well as minor erythema and itchiness to the face/cheek.   Past Medical History  Diagnosis Date  . Obesity   . Rectal bleeding    Past Surgical History  Procedure Laterality Date  . No past surgeries     Family History  Problem Relation Age of Onset  . Pancreatic cancer Maternal Grandmother   . Diabetes Maternal Grandmother   . Diabetes Maternal Aunt   . Colon cancer Neg Hx    History  Substance Use Topics  . Smoking status: Current Every Day Smoker    Last Attempt to Quit: 08/12/2008  . Smokeless tobacco: Never Used  . Alcohol Use: No     Comment: socially   OB History   Grav Para Term Preterm Abortions TAB SAB Ect Mult Living                 Review of Systems  Constitutional: Negative for fever, diaphoresis and activity change.  HENT: Negative.   Respiratory: Negative.   Cardiovascular: Negative.   Gastrointestinal: Positive for constipation. Negative for nausea, vomiting, abdominal pain, diarrhea, blood in stool, abdominal distention and rectal pain.  Genitourinary: Negative.   Musculoskeletal: Positive for back pain.  Skin: Negative.   Neurological: Negative.   Psychiatric/Behavioral: Negative.     Allergies  Review of  patient's allergies indicates no known allergies.  Home Medications   Current Outpatient Rx  Name  Route  Sig  Dispense  Refill  . meloxicam (MOBIC) 15 MG tablet   Oral   Take 1 tablet (15 mg total) by mouth daily.   15 tablet   0   . metroNIDAZOLE (FLAGYL) 500 MG tablet   Oral   Take 1 tablet (500 mg total) by mouth 3 (three) times daily.   30 tablet   0   . Multiple Vitamin (MULTIVITAMIN) tablet   Oral   Take 1 tablet by mouth daily.           Marland Kitchen Specialty Vitamins Products (WEIGHT LOSS DAILY MULTI PO)   Oral   Take 1 tablet by mouth. As directed          . triamcinolone cream (KENALOG) 0.1 %   Topical   Apply 1 application topically 2 (two) times daily. Apply for 2 weeks. May use on face   30 g   0    BP 125/77  Pulse 68  Temp(Src) 98.3 F (36.8 C) (Oral)  Resp 18  SpO2 100% Physical Exam  Nursing note and vitals reviewed. Constitutional: She is oriented to person, place, and time. She appears well-developed and well-nourished. No distress.  Eyes: Conjunctivae and EOM are normal. No scleral icterus.  Neck: Normal range of motion. Neck supple.  Cardiovascular: Normal rate,  regular rhythm and normal heart sounds.   Pulmonary/Chest: Effort normal and breath sounds normal. No respiratory distress. She has no wheezes.  Abdominal: Soft. Bowel sounds are normal. She exhibits no distension and no mass. There is no rebound and no guarding.  Mild tenderness of the right upper quadrant, tenderness increased with taking a deep breath however Percell Miller sign would be considered negative. Remainder of the examination of the abdomen is normal  Musculoskeletal: She exhibits no edema and no tenderness.  No CVAT. Unable to reproduce the pain in the left flank.  Lymphadenopathy:    She has no cervical adenopathy.  Neurological: She is alert and oriented to person, place, and time.  Skin: Skin is warm and dry. Rash noted. She is not diaphoretic.  Psychiatric: She has a normal mood  and affect.    ED Course  Procedures (including critical care time) Labs Review Labs Reviewed  POCT URINALYSIS DIP (DEVICE)  POCT PREGNANCY, URINE   Imaging Review No results found.  Results for orders placed during the hospital encounter of 08/25/13  POCT URINALYSIS DIP (DEVICE)      Result Value Range   Glucose, UA NEGATIVE  NEGATIVE mg/dL   Bilirubin Urine NEGATIVE  NEGATIVE   Ketones, ur NEGATIVE  NEGATIVE mg/dL   Specific Gravity, Urine 1.010  1.005 - 1.030   Hgb urine dipstick NEGATIVE  NEGATIVE   pH 7.0  5.0 - 8.0   Protein, ur NEGATIVE  NEGATIVE mg/dL   Urobilinogen, UA 0.2  0.0 - 1.0 mg/dL   Nitrite NEGATIVE  NEGATIVE   Leukocytes, UA NEGATIVE  NEGATIVE  POCT PREGNANCY, URINE      Result Value Range   Preg Test, Ur NEGATIVE  NEGATIVE      MDM   1. Stool clay color   2. Left flank pain, chronic   3. RUQ abdominal tenderness   4. Contact dermatitis     Triamcinolone cream for the rash Recommend he see your PCP on Monday as scheduled, he will probably need to have lab work, possible gallbladder ultrasound and workup for your left flank pain. For any worsening new symptoms or problems may return. Patient is discharged in stable condition.  Janne Napoleon, NP 08/25/13 1357

## 2013-08-25 NOTE — ED Notes (Signed)
C/o stool color change since yesterday morning States she has white stool States she is a little constipated than usual.   No diet changes Denies blood in stool States she does have lower back  States she has a rash that she would like provider to see States she is fatigue lately

## 2013-08-26 NOTE — ED Provider Notes (Signed)
Medical screening examination/treatment/procedure(s) were performed by a resident physician or non-physician practitioner and as the supervising physician I was immediately available for consultation/collaboration.  Adonai Selsor, MD    Zakayla Martinec S Juana Montini, MD 08/26/13 0751 

## 2017-12-26 ENCOUNTER — Other Ambulatory Visit (HOSPITAL_COMMUNITY): Payer: Self-pay | Admitting: Obstetrics

## 2017-12-26 DIAGNOSIS — Z3A24 24 weeks gestation of pregnancy: Secondary | ICD-10-CM

## 2017-12-26 DIAGNOSIS — Z3689 Encounter for other specified antenatal screening: Secondary | ICD-10-CM

## 2018-01-06 ENCOUNTER — Encounter (HOSPITAL_COMMUNITY): Payer: Self-pay

## 2018-01-13 ENCOUNTER — Ambulatory Visit (HOSPITAL_COMMUNITY): Payer: No Typology Code available for payment source

## 2018-01-13 ENCOUNTER — Encounter (HOSPITAL_COMMUNITY): Payer: Self-pay

## 2018-01-20 ENCOUNTER — Encounter (HOSPITAL_COMMUNITY): Payer: Self-pay

## 2018-01-28 ENCOUNTER — Ambulatory Visit (HOSPITAL_COMMUNITY)
Admission: RE | Admit: 2018-01-28 | Discharge: 2018-01-28 | Disposition: A | Discharge status: Home / Self Care | Payer: Medicaid Other | Source: Ambulatory Visit | Attending: Obstetrics | Admitting: Obstetrics

## 2018-01-28 DIAGNOSIS — Z3689 Encounter for other specified antenatal screening: Secondary | ICD-10-CM

## 2018-01-28 DIAGNOSIS — O09523 Supervision of elderly multigravida, third trimester: Secondary | ICD-10-CM | POA: Insufficient documentation

## 2018-01-28 DIAGNOSIS — Z3A24 24 weeks gestation of pregnancy: Secondary | ICD-10-CM

## 2018-01-28 DIAGNOSIS — Z363 Encounter for antenatal screening for malformations: Secondary | ICD-10-CM | POA: Insufficient documentation

## 2018-01-28 DIAGNOSIS — Z3A27 27 weeks gestation of pregnancy: Secondary | ICD-10-CM | POA: Insufficient documentation

## 2018-02-21 ENCOUNTER — Encounter (HOSPITAL_COMMUNITY): Payer: Self-pay | Admitting: *Deleted

## 2018-02-21 ENCOUNTER — Inpatient Hospital Stay (HOSPITAL_COMMUNITY)
Admission: AD | Admit: 2018-02-21 | Discharge: 2018-02-21 | Disposition: A | Discharge status: Home / Self Care | Payer: Medicaid Other | Source: Ambulatory Visit | Attending: Obstetrics and Gynecology | Admitting: Obstetrics and Gynecology

## 2018-02-21 DIAGNOSIS — Z800 Family history of malignant neoplasm of digestive organs: Secondary | ICD-10-CM | POA: Diagnosis not present

## 2018-02-21 DIAGNOSIS — E669 Obesity, unspecified: Secondary | ICD-10-CM | POA: Insufficient documentation

## 2018-02-21 DIAGNOSIS — R05 Cough: Secondary | ICD-10-CM | POA: Diagnosis present

## 2018-02-21 DIAGNOSIS — Z3A31 31 weeks gestation of pregnancy: Secondary | ICD-10-CM | POA: Insufficient documentation

## 2018-02-21 DIAGNOSIS — Z87891 Personal history of nicotine dependence: Secondary | ICD-10-CM | POA: Insufficient documentation

## 2018-02-21 DIAGNOSIS — O99513 Diseases of the respiratory system complicating pregnancy, third trimester: Secondary | ICD-10-CM | POA: Insufficient documentation

## 2018-02-21 DIAGNOSIS — O99213 Obesity complicating pregnancy, third trimester: Secondary | ICD-10-CM | POA: Insufficient documentation

## 2018-02-21 DIAGNOSIS — R0981 Nasal congestion: Secondary | ICD-10-CM | POA: Diagnosis not present

## 2018-02-21 DIAGNOSIS — O98513 Other viral diseases complicating pregnancy, third trimester: Secondary | ICD-10-CM | POA: Diagnosis not present

## 2018-02-21 DIAGNOSIS — J069 Acute upper respiratory infection, unspecified: Secondary | ICD-10-CM | POA: Diagnosis not present

## 2018-02-21 DIAGNOSIS — J029 Acute pharyngitis, unspecified: Secondary | ICD-10-CM | POA: Diagnosis present

## 2018-02-21 LAB — URINALYSIS, ROUTINE W REFLEX MICROSCOPIC
BILIRUBIN URINE: NEGATIVE
Glucose, UA: NEGATIVE mg/dL
Hgb urine dipstick: NEGATIVE
Ketones, ur: NEGATIVE mg/dL
Nitrite: NEGATIVE
Protein, ur: NEGATIVE mg/dL
Specific Gravity, Urine: 1.016 (ref 1.005–1.030)
pH: 7 (ref 5.0–8.0)

## 2018-02-21 LAB — WET PREP, GENITAL
CLUE CELLS WET PREP: NONE SEEN
SPERM: NONE SEEN
TRICH WET PREP: NONE SEEN
YEAST WET PREP: NONE SEEN

## 2018-02-21 MED ORDER — BENZONATATE 100 MG PO CAPS
200.0000 mg | ORAL_CAPSULE | Freq: Once | ORAL | Status: AC
  Administered 2018-02-21: 200 mg via ORAL
  Filled 2018-02-21: qty 2

## 2018-02-21 MED ORDER — BENZONATATE 100 MG PO CAPS: 200.0000 mg | ORAL_CAPSULE | Freq: Three times a day (TID) | ORAL | 0 refills | Status: AC | PRN

## 2018-02-21 MED ORDER — LORATADINE 10 MG PO TABS: 10.0000 mg | ORAL_TABLET | Freq: Every day | ORAL | 0 refills | Status: AC

## 2018-02-21 MED ORDER — LORATADINE 10 MG PO TABS
10.0000 mg | ORAL_TABLET | Freq: Every day | ORAL | Status: DC
  Administered 2018-02-21: 10 mg via ORAL
  Filled 2018-02-21 (×2): qty 1

## 2018-02-21 NOTE — MAU Note (Addendum)
+  sore throat +congestion +cough; productive green in color +eyes burning +ear drainage X 1week  +FM; FHR 153  +vaginal itching +vaginal odor Denies discharge or vb  Vitals:   02/21/18 1248  BP: 108/89  Pulse: 88  Resp: 19  Temp: 98.2 F (36.8 C)  SpO2: 97%

## 2018-02-21 NOTE — Discharge Instructions (Signed)
Upper Respiratory Infection, Adult Most upper respiratory infections (URIs) are caused by a virus. A URI affects the nose, throat, and upper air passages. The most common type of URI is often called "the common cold." Follow these instructions at home:  Take medicines only as told by your doctor.  Gargle warm saltwater or take cough drops to comfort your throat as told by your doctor.  Use a warm mist humidifier or inhale steam from a shower to increase air moisture. This may make it easier to breathe.  Drink enough fluid to keep your pee (urine) clear or pale yellow.  Eat soups and other clear broths.  Have a healthy diet.  Rest as needed.  Go back to work when your fever is gone or your doctor says it is okay. ? You may need to stay home longer to avoid giving your URI to others. ? You can also wear a face mask and wash your hands often to prevent spread of the virus.  Use your inhaler more if you have asthma.  Do not use any tobacco products, including cigarettes, chewing tobacco, or electronic cigarettes. If you need help quitting, ask your doctor. Contact a doctor if:  You are getting worse, not better.  Your symptoms are not helped by medicine.  You have chills.  You are getting more short of breath.  You have brown or red mucus.  You have yellow or brown discharge from your nose.  You have pain in your face, especially when you bend forward.  You have a fever.  You have puffy (swollen) neck glands.  You have pain while swallowing.  You have white areas in the back of your throat. Get help right away if:  You have very bad or constant: ? Headache. ? Ear pain. ? Pain in your forehead, behind your eyes, and over your cheekbones (sinus pain). ? Chest pain.  You have long-lasting (chronic) lung disease and any of the following: ? Wheezing. ? Long-lasting cough. ? Coughing up blood. ? A change in your usual mucus.  You have a stiff neck.  You have  changes in your: ? Vision. ? Hearing. ? Thinking. ? Mood. This information is not intended to replace advice given to you by your health care provider. Make sure you discuss any questions you have with your health care provider. Document Released: 01/15/2008 Document Revised: 03/31/2016 Document Reviewed: 11/03/2013 Elsevier Interactive Patient Education  2018 [employer]  

## 2018-02-21 NOTE — MAU Provider Note (Signed)
History   G4 at 31 weeks and 2 days in with cough and congestion for a week she states it feels like it is in the back of her throat her eyes are now itchy and her ears are stopped up.  She denies any fever.  The cough is nonproductive and dry.  She denies rupture membranes or vaginal bleeding or any abdominal pain.  CSN: [id number]  Arrival date & time 02/21/18  1224   None     Chief Complaint  Patient presents with  . Vaginal Itching  . Sore Throat  . Cough  . Nasal Congestion    HPI  Past Medical History:  Diagnosis Date  . Obesity   . Rectal bleeding     Past Surgical History:  Procedure Laterality Date  . NO PAST SURGERIES      Family History  Problem Relation Age of Onset  . Pancreatic cancer Maternal Grandmother   . Diabetes Maternal Grandmother   . Diabetes Maternal Aunt   . Colon cancer Neg Hx     Social History   Tobacco Use  . Smoking status: Former Smoker    Last attempt to quit: 08/12/2008    Years since quitting: 9.5  . Smokeless tobacco: Never Used  Substance Use Topics  . Alcohol use: No    Comment: socially  . Drug use: No    OB History    Gravida  7   Para  4   Term  4   Preterm      AB  2   Living        SAB  1   TAB  1   Ectopic      Multiple      Live Births              Review of Systems  Constitutional: Negative.   HENT: Positive for congestion, postnasal drip and sinus pressure.   Eyes: Positive for itching.  Respiratory: Positive for cough and chest tightness.   Cardiovascular: Negative.   Gastrointestinal: Negative.   Endocrine: Negative.   Genitourinary: Negative.   Musculoskeletal: Negative.   Skin: Negative.   Allergic/Immunologic: Negative.   Neurological: Negative.   Hematological: Negative.   Psychiatric/Behavioral: Negative.     Allergies  Patient has no known allergies.  Home Medications    BP 108/89 (BP Location: Right Arm)   Pulse 88   Temp 98.2 F (36.8 C) (Oral)   Resp 19    Wt 248 lb 1.9 oz (112.5 kg)   SpO2 97% Comment: ra  BMI 40.05 kg/m   Physical Exam  Constitutional: She is oriented to person, place, and time. She appears well-developed and well-nourished.  HENT:  Head: Normocephalic.  Mouth/Throat: Oropharynx is clear and moist and mucous membranes are normal.  Eyes: Pupils are equal, round, and reactive to light.  Neck: Normal range of motion.  Cardiovascular: Normal rate, regular rhythm, normal heart sounds and intact distal pulses.  Pulmonary/Chest: Effort normal and breath sounds normal.  Abdominal: Soft. Bowel sounds are normal.  Neurological: She is alert and oriented to person, place, and time.  Skin: Skin is warm and dry.  Psychiatric: She has a normal mood and affect. Her behavior is normal.    MAU Course  Procedures (including critical care time)  Labs Reviewed  URINALYSIS, ROUTINE W REFLEX MICROSCOPIC - Abnormal; Notable for the following components:      Result Value   Leukocytes, UA LARGE (*)    Bacteria, UA  RARE (*)    All other components within normal limits   No results found.   1. Upper respiratory infection, viral       MDM  Vital signs are stable.  Fetal heart rate pattern is reassuring with a cells noted no contractions.  Lungs are clear to auscultation bilaterally.  Abdomen soft gravid nontender.  After treatment with some Tessalon and Zyrtec patient is now feeling better.  Plan of care discussed with Dr. Wynona Luna patient is ready for discharge.

## 2018-02-23 LAB — GC/CHLAMYDIA PROBE AMP (~~LOC~~) NOT AT ARMC
CHLAMYDIA, DNA PROBE: NEGATIVE
Neisseria Gonorrhea: NEGATIVE

## 2018-07-16 ENCOUNTER — Encounter (HOSPITAL_COMMUNITY): Payer: Self-pay

## 2018-12-20 IMAGING — US US MFM OB DETAIL+14 WK
1 of 2 series · 13 of 28 positions shown · non-contrast
Comparison: none

[Series 1: us mfm ob detail+14 wk · 86 acquisitions, 13 frames shown]
[im 4/86]
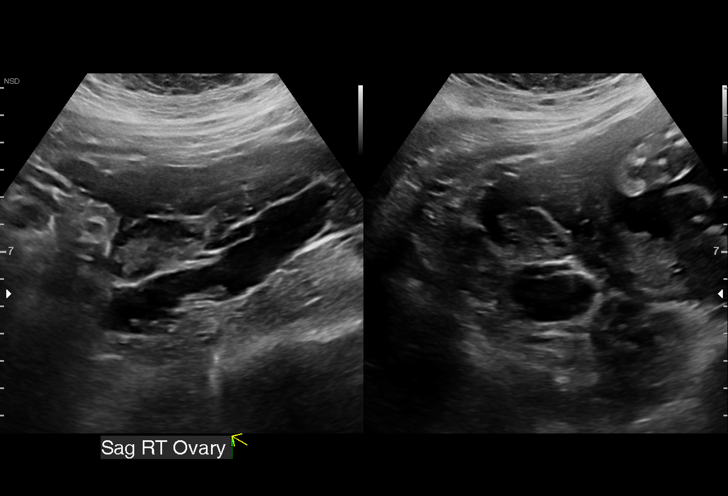
[im 10/86]
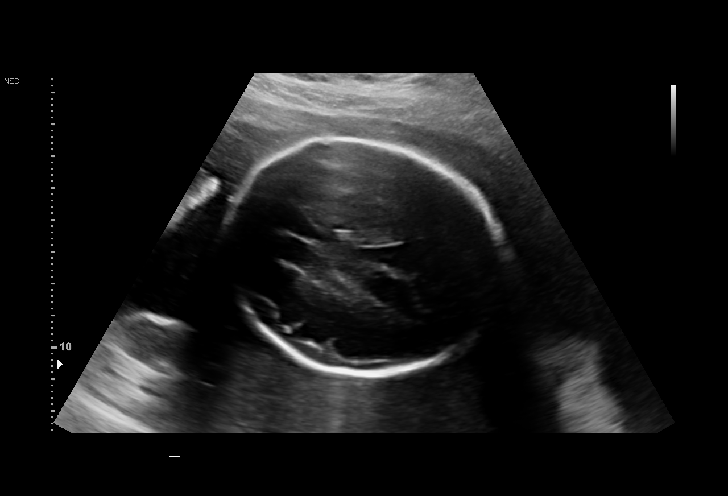
[im 17/86]
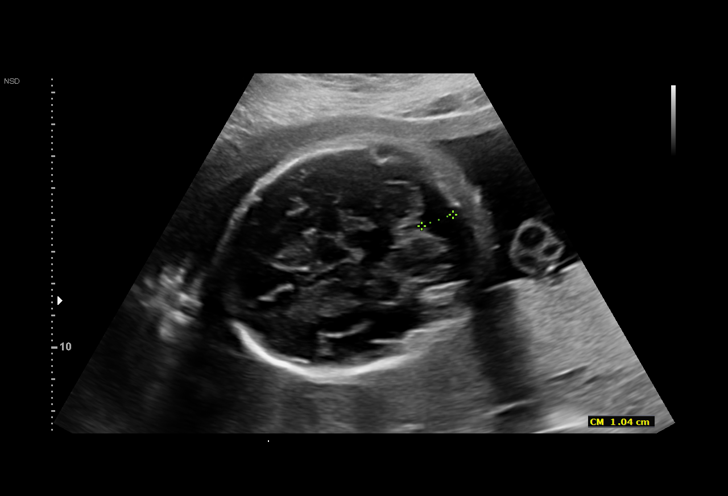
[im 23/86]
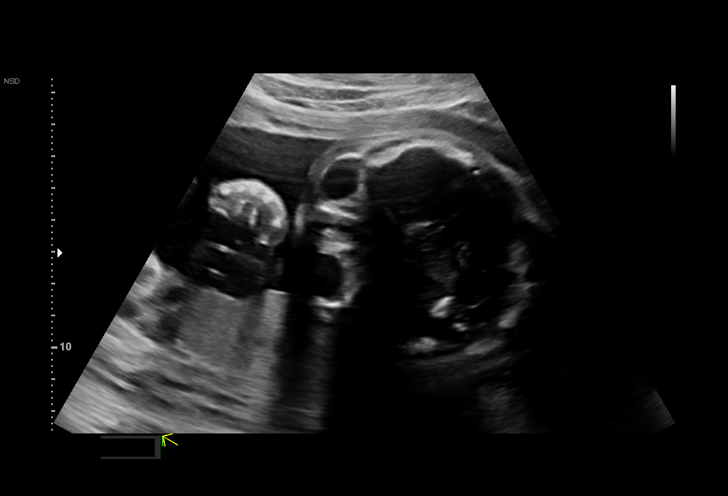
[im 30/86]
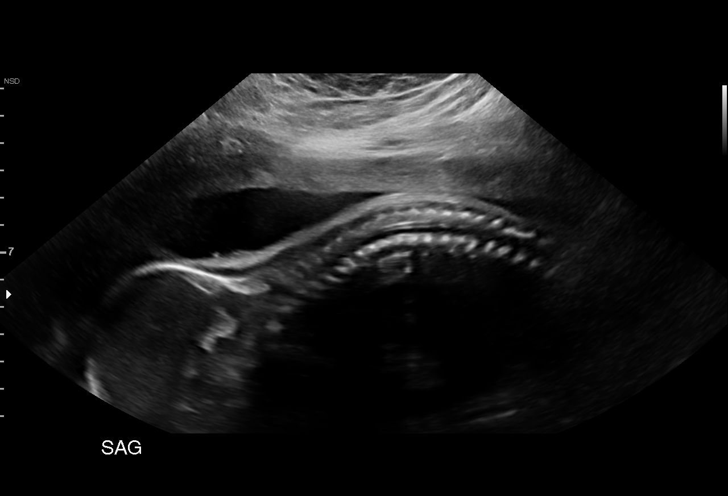
[im 36/86]
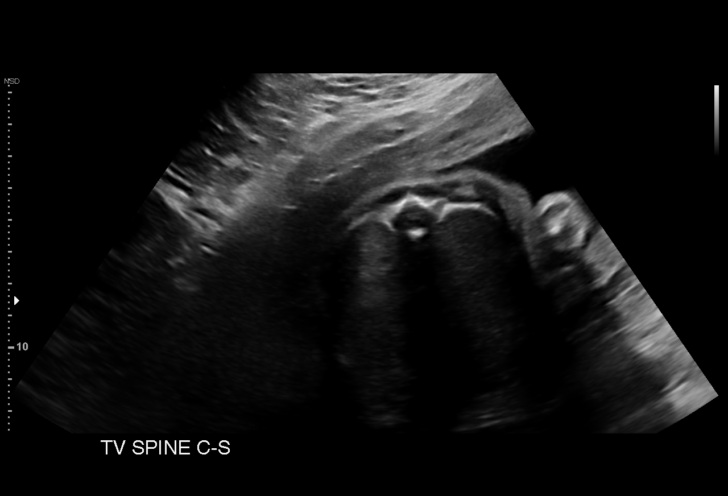
[im 46/86]
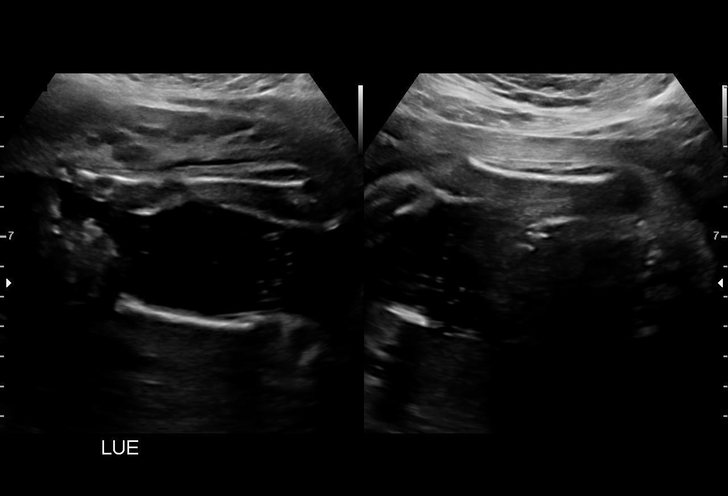
[im 53/86]
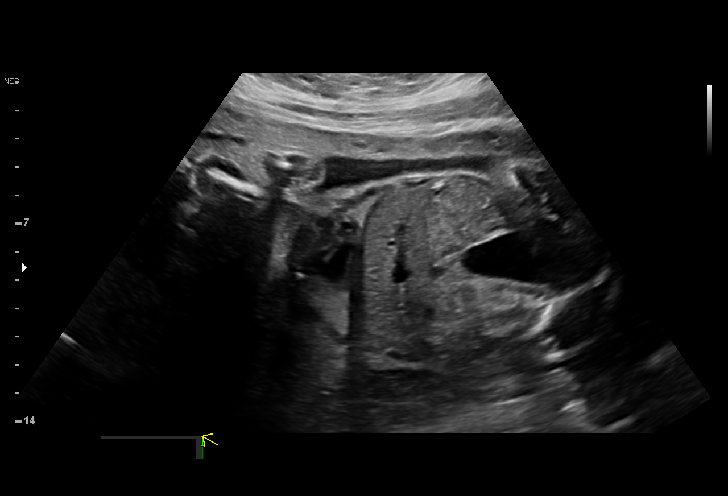
[im 59/86]
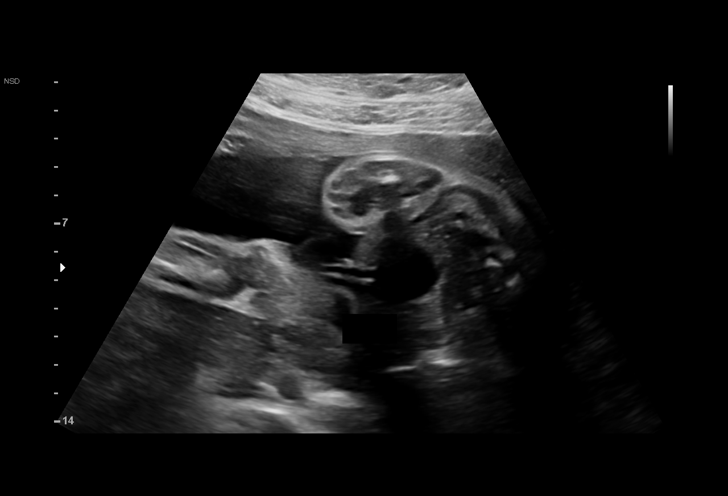
[im 66/86]
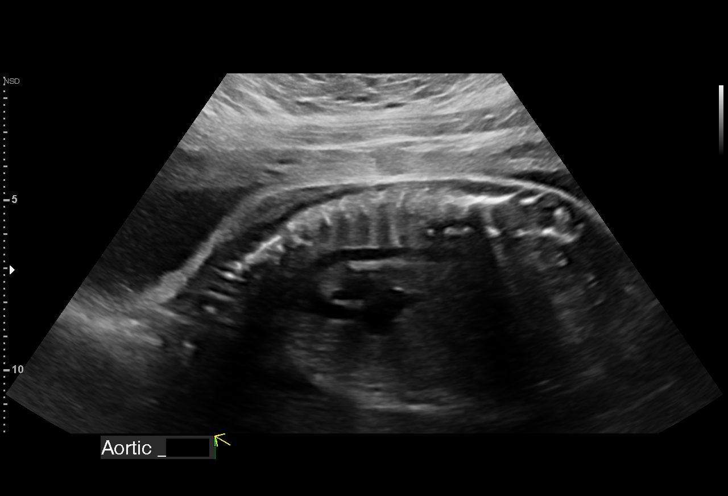
[im 72/86]
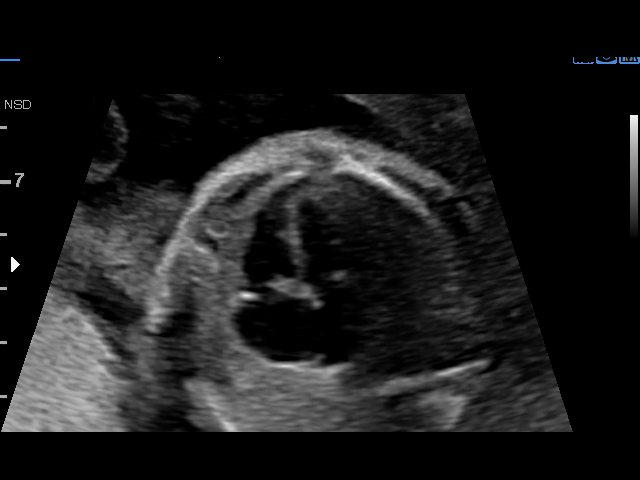
[im 79/86]
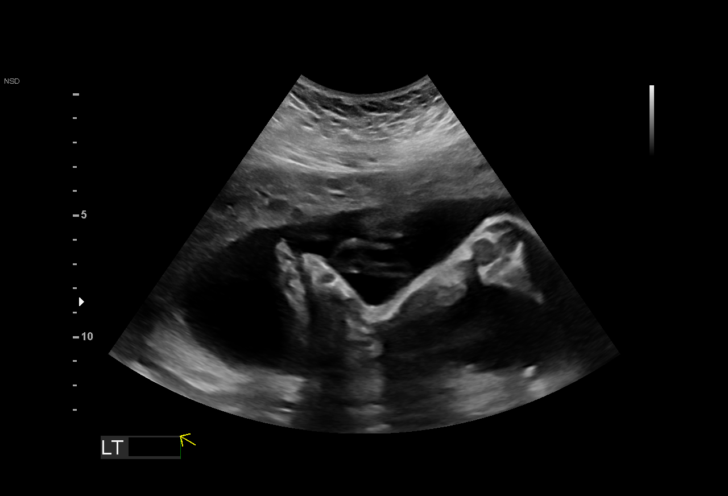
[im 86/86]
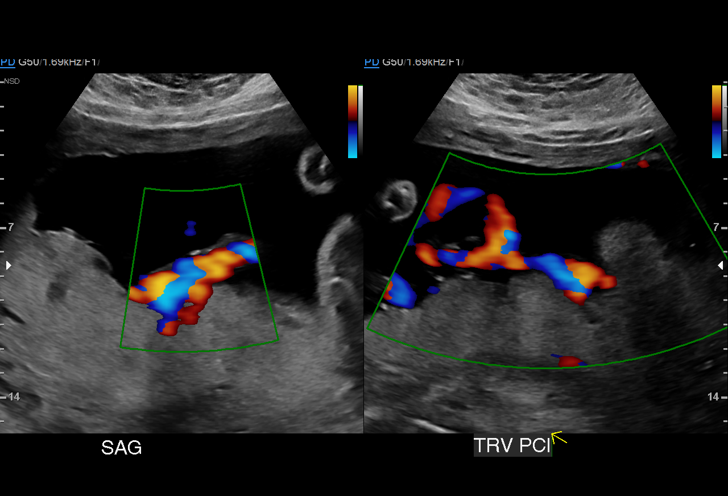

[13 of 28 positions shown; findings below may reference images not displayed]

OBGYN

1  MERVY PAGA             [id number]       [id number]     [id number]
Indications

27 weeks gestation of pregnancy
Encounter for antenatal screening for
malformations
Advanced maternal age multigravida 37+,
third trimester
OB History

Gravidity:    7         Term:   4        Prem:   0        SAB:   1
TOP:          1       Ectopic:  0        Living: 4
Fetal Evaluation

Num Of Fetuses:     1
Fetal Heart         144
Rate(bpm):
Cardiac Activity:   Observed
Presentation:       Frank breech
Placenta:           Posterior, above cervical os
P. Cord Insertion:  Visualized

Amniotic Fluid
AFI FV:      Subjectively within normal limits

Largest Pocket(cm)
6.53
Biometry
BPD:        71  mm     G. Age:  28w 4d         60  %    CI:        72.23   %    70 - 86
FL/HC:      19.8   %    18.8 -
HC:      265.8  mm     G. Age:  29w 0d         54  %    HC/AC:      1.08        1.05 -
AC:      245.6  mm     G. Age:  28w 5d         71  %    FL/BPD:     73.9   %    71 - 87
FL:       52.5  mm     G. Age:  28w 0d         38  %    FL/AC:      21.4   %    20 - 24
HUM:      49.9  mm     G. Age:  29w 2d         75  %
CM:       10.4  mm

Est. FW:    5045  gm    2 lb 12 oz      64  %
Gestational Age

U/S Today:     28w 4d                                        EDD:   04/18/18
Best:          27w 6d     Det. By:  Early Ultrasound         EDD:   04/23/18
Anatomy

Cranium:               Appears normal         Aortic Arch:            Appears normal
Cavum:                 Appears normal         Ductal Arch:            Not well visualized
Ventricles:            Appears normal         Diaphragm:              Appears normal
Choroid Plexus:        Appears normal         Stomach:                Appears normal, left
sided
Cerebellum:            Appears normal         Abdomen:                Appears normal
Posterior Fossa:       Enlarged cisternal     Abdominal Wall:         Appears nml (cord
magna,  10mm
insert, abd wall)
Nuchal Fold:           Not applicable (>20    Cord Vessels:           Appears normal (3
wks GA)                                        vessel cord)
Face:                  Appears normal         Kidneys:                Appear normal
(orbits and profile)
Lips:                  Appears normal         Bladder:                Appears normal
Thoracic:              Appears normal         Spine:                  Appears normal
Heart:                 Appears normal         Upper Extremities:      Visualized
(4CH, axis, and situs
RVOT:                  3VV seen               Lower Extremities:      Appears normal
LVOT:                  Appears normal

Other:  Fetus appears to be a male.
Cervix Uterus Adnexa

Cervix
Length:            3.4  cm.
Normal appearance by transabdominal scan.

Left Ovary
Within normal limits.

Right Ovary
Within normal limits.

Adnexa:       No abnormality visualized.
Impression

Single living intrauterine pregnancy at 27w 6d.
Frank breech presentation.
Placenta Posterior, above cervical os.
Appropriate fetal growth.
Normal amniotic fluid volume.
The ductal arch and hands are not well visualized.
The fetal anatomic survey is otherwise complete.
This cisterna magna measures just above the upper limit of
normal at 10.4mm. However, the cisterna magna is
measured in a suboptimal plane which likely accounts for the
enlarged measurement. The posterior fossa otherwise
appears within normal limits.
Normal fetal anatomy.
No fetal anomalies or soft markers of aneuploidy seen.
The adnexa appear normal bilaterally without masses.
The cervix measures 3.4cm on transabdominal imaging
without funneling.
Recommendations

Remote read. Findings of today's ultrasound not discussed
with the patient.
Recommend follow-up ultrasound examination in 4 weeks for
reassessment of fetal growth and anatomy.
